# Patient Record
Sex: Male | Born: 1975 | Race: Black or African American | Hispanic: No | Marital: Single | Smoking: Current every day smoker
Health system: Southern US, Community
[De-identification: ages and names within clinical notes are randomized; demographics above are authoritative.]

## PROBLEM LIST (undated history)

## (undated) HISTORY — PX: WRIST SURGERY: SHX841

---

## 2005-12-07 ENCOUNTER — Emergency Department: Payer: Self-pay | Admitting: Emergency Medicine

## 2006-07-10 ENCOUNTER — Emergency Department: Payer: Self-pay | Admitting: Emergency Medicine

## 2008-09-12 ENCOUNTER — Emergency Department: Payer: Self-pay | Admitting: Emergency Medicine

## 2012-05-10 ENCOUNTER — Emergency Department: Payer: Self-pay | Admitting: Emergency Medicine

## 2012-05-20 ENCOUNTER — Emergency Department: Payer: Self-pay | Admitting: Emergency Medicine

## 2019-06-14 ENCOUNTER — Emergency Department
Admission: EM | Admit: 2019-06-14 | Discharge: 2019-06-14 | Disposition: A | Discharge status: Home / Self Care | Payer: Self-pay | Attending: Emergency Medicine | Admitting: Emergency Medicine

## 2019-06-14 ENCOUNTER — Other Ambulatory Visit: Payer: Self-pay

## 2019-06-14 ENCOUNTER — Emergency Department: Payer: Self-pay

## 2019-06-14 ENCOUNTER — Encounter: Payer: Self-pay | Admitting: Emergency Medicine

## 2019-06-14 DIAGNOSIS — R079 Chest pain, unspecified: Secondary | ICD-10-CM

## 2019-06-14 DIAGNOSIS — K2900 Acute gastritis without bleeding: Secondary | ICD-10-CM | POA: Insufficient documentation

## 2019-06-14 DIAGNOSIS — F17210 Nicotine dependence, cigarettes, uncomplicated: Secondary | ICD-10-CM | POA: Insufficient documentation

## 2019-06-14 LAB — CBC WITH DIFFERENTIAL/PLATELET
Abs Immature Granulocytes: 0.02 10*3/uL (ref 0.00–0.07)
Basophils Absolute: 0 10*3/uL (ref 0.0–0.1)
Basophils Relative: 0 %
Eosinophils Absolute: 0 10*3/uL (ref 0.0–0.5)
Eosinophils Relative: 0 %
HCT: 41.5 % (ref 39.0–52.0)
Hemoglobin: 14.5 g/dL (ref 13.0–17.0)
Immature Granulocytes: 0 %
Lymphocytes Relative: 25 %
Lymphs Abs: 1.8 10*3/uL (ref 0.7–4.0)
MCH: 30.7 pg (ref 26.0–34.0)
MCHC: 34.9 g/dL (ref 30.0–36.0)
MCV: 87.7 fL (ref 80.0–100.0)
Monocytes Absolute: 0.4 10*3/uL (ref 0.1–1.0)
Monocytes Relative: 6 %
Neutro Abs: 4.7 10*3/uL (ref 1.7–7.7)
Neutrophils Relative %: 69 %
Platelets: 276 10*3/uL (ref 150–400)
RBC: 4.73 MIL/uL (ref 4.22–5.81)
RDW: 13.1 % (ref 11.5–15.5)
WBC: 7 10*3/uL (ref 4.0–10.5)
nRBC: 0 % (ref 0.0–0.2)

## 2019-06-14 LAB — COMPREHENSIVE METABOLIC PANEL
ALT: 13 U/L (ref 0–44)
AST: 17 U/L (ref 15–41)
Albumin: 4 g/dL (ref 3.5–5.0)
Alkaline Phosphatase: 44 U/L (ref 38–126)
Anion gap: 12 (ref 5–15)
BUN: 10 mg/dL (ref 6–20)
CO2: 21 mmol/L — ABNORMAL LOW (ref 22–32)
Calcium: 8.6 mg/dL — ABNORMAL LOW (ref 8.9–10.3)
Chloride: 104 mmol/L (ref 98–111)
Creatinine, Ser: 0.96 mg/dL (ref 0.61–1.24)
GFR calc Af Amer: >60 mL/min (ref >60)
GFR calc non Af Amer: >60 mL/min (ref >60)
Glucose, Bld: 111 mg/dL — ABNORMAL HIGH (ref 70–99)
Potassium: 3.6 mmol/L (ref 3.5–5.1)
Sodium: 137 mmol/L (ref 135–145)
Total Bilirubin: 0.5 mg/dL (ref 0.3–1.2)
Total Protein: 6.4 g/dL — ABNORMAL LOW (ref 6.5–8.1)

## 2019-06-14 LAB — LIPASE, BLOOD: Lipase: 25 U/L (ref 11–51)

## 2019-06-14 LAB — TROPONIN I (HIGH SENSITIVITY): Troponin I (High Sensitivity): 4 ng/L (ref <18)

## 2019-06-14 MED ORDER — SUCRALFATE 1 GM/10ML PO SUSP: 1.0000 g | Freq: Four times a day (QID) | ORAL | 1 refills | Status: DC

## 2019-06-14 MED ORDER — ALUM & MAG HYDROXIDE-SIMETH 200-200-20 MG/5ML PO SUSP
30.0000 mL | Freq: Once | ORAL | Status: AC
  Administered 2019-06-14: 03:00:00 30 mL via ORAL
  Filled 2019-06-14: qty 30

## 2019-06-14 MED ORDER — SUCRALFATE 1 GM/10ML PO SUSP: 1.0000 g | Freq: Four times a day (QID) | ORAL | 0 refills | Status: AC

## 2019-06-14 MED ORDER — FAMOTIDINE 20 MG PO TABS: 20.0000 mg | ORAL_TABLET | Freq: Two times a day (BID) | ORAL | 0 refills | Status: AC

## 2019-06-14 MED ORDER — LIDOCAINE VISCOUS HCL 2 % MT SOLN
15.0000 mL | Freq: Once | OROMUCOSAL | Status: AC
  Administered 2019-06-14: 03:00:00 15 mL via ORAL
  Filled 2019-06-14: qty 15

## 2019-06-14 MED ORDER — FAMOTIDINE 20 MG PO TABS: 20.0000 mg | ORAL_TABLET | Freq: Two times a day (BID) | ORAL | 0 refills | Status: DC

## 2019-06-14 MED ORDER — FAMOTIDINE 20 MG PO TABS
20.0000 mg | ORAL_TABLET | Freq: Once | ORAL | Status: AC
  Administered 2019-06-14: 20 mg via ORAL
  Filled 2019-06-14: qty 1

## 2019-06-14 NOTE — ED Triage Notes (Signed)
Patient ambulatory to triage with steady gait, without difficulty or distress noted, mask in place; pt reports mid CP radiating into left lateral ribcage accomp by [facility] intermittently for 39mos

## 2019-06-14 NOTE — Discharge Instructions (Signed)
1.  Start Pepcid 20 mg twice daily. 2.  Start Carafate 4 times daily. 3.  Avoid heavy and spicy foods. 4.  Return to the ER for worsening symptoms, persistent vomiting, difficulty breathing or other concerns.

## 2019-06-14 NOTE — ED Provider Notes (Signed)
[facility] Emergency Department Provider Note   ____________________________________________   First MD Initiated Contact with Patient 06/14/19 [phone]     (approximate)  I have reviewed the triage vital signs and the nursing notes.   HISTORY  Chief Complaint Chest Pain    HPI Corey Cooke is a 44 y.o. male who presents to the ED from home with a chief complaint of chest pain.  Patient reports symptoms since 2017.  Was evaluated once which was a normal evaluation.  Reports occasional chest tightness/burning sometimes associated with shortness of breath.  Reports symptoms more often occurs before 2 PM.  Some mornings he wakes up vomiting.  Reports daily drinking (beer after work with a shot of liquor).  No history of DTs.  Takes Tylenol but no NSAIDs such as aspirin, ibuprofen or Goody powder.  Denies fever, cough, abdominal pain, diarrhea.  Denies recent travel, trauma or hormone use.       Past medical history None  There are no problems to display for this patient.   Past Surgical History:  Procedure Laterality Date  . WRIST SURGERY Left     Prior to Admission medications   Medication Sig Start Date End Date Taking? Authorizing Provider  famotidine (PEPCID) 20 MG tablet Take 1 tablet (20 mg total) by mouth 2 (two) times daily. 06/14/19   Paulette Blanch, MD  sucralfate (CARAFATE) 1 GM/10ML suspension Take 10 mLs (1 g total) by mouth 4 (four) times daily. 06/14/19   Paulette Blanch, MD    Allergies Patient has no known allergies.  Family history None for CAD  Social History Social History   Tobacco Use  . Smoking status: Current Every Day Smoker  . Smokeless tobacco: Never Used  Substance Use Topics  . Alcohol use: Not on file  . Drug use: Not on file  Daily alcohol use  Review of Systems  Constitutional: No fever/chills Eyes: No visual changes. ENT: No sore throat. Cardiovascular: Positive for chest pain. Respiratory: Denies  shortness of breath. Gastrointestinal: No abdominal pain.  No nausea, no vomiting.  No diarrhea.  No constipation. Genitourinary: Negative for dysuria. Musculoskeletal: Negative for back pain. Skin: Negative for rash. Neurological: Negative for headaches, focal weakness or numbness.   ____________________________________________   PHYSICAL EXAM:  VITAL SIGNS: ED Triage Vitals  Enc Vitals Group     BP 06/14/19 0054 (!) 132/91     Pulse Rate 06/14/19 0054 99     Resp 06/14/19 0054 19     Temp 06/14/19 0054 98.6 F (37 C)     Temp Source 06/14/19 0054 Oral     SpO2 06/14/19 0054 96 %     Weight 06/14/19 0038 170 lb (77.1 kg)     Height 06/14/19 0038 6\' 1"  (1.854 m)     Head Circumference --      Peak Flow --      Pain Score 06/14/19 0038 2     Pain Loc --      Pain Edu? --      Excl. in [place]? --     Constitutional: Alert and oriented. Well appearing and in no acute distress. Eyes: Conjunctivae are normal. PERRL. EOMI. Head: Atraumatic. Nose: No congestion/rhinnorhea. Mouth/Throat: Mucous membranes are moist.  Oropharynx non-erythematous. Neck: No stridor.   Cardiovascular: Normal rate, regular rhythm. Grossly normal heart sounds.  Good peripheral circulation. Respiratory: Normal respiratory effort.  No retractions. Lungs CTAB. Gastrointestinal: Soft and nontender to light or deep palpation. No  distention. No abdominal bruits. No CVA tenderness. Musculoskeletal: No lower extremity tenderness nor edema.  No joint effusions. Neurologic:  Normal speech and language. No gross focal neurologic deficits are appreciated. No gait instability. Skin:  Skin is warm, dry and intact. No rash noted. Psychiatric: Mood and affect are normal. Speech and behavior are normal.  ____________________________________________   LABS (all labs ordered are listed, but only abnormal results are displayed)  Labs Reviewed  COMPREHENSIVE METABOLIC PANEL - Abnormal; Notable for the following  components:      Result Value   CO2 21 (*)    Glucose, Bld 111 (*)    Calcium 8.6 (*)    Total Protein 6.4 (*)    All other components within normal limits  CBC WITH DIFFERENTIAL/PLATELET  LIPASE, BLOOD  TROPONIN I (HIGH SENSITIVITY)   ____________________________________________  EKG  ED ECG REPORT I, Mang Hazelrigg J, the attending physician, personally viewed and interpreted this ECG.   Date: 06/14/2019  EKG Time: 0058  Rate: 82  Rhythm: normal EKG, normal sinus rhythm  Axis: Normal  Intervals:none  ST&T Change: Nonspecific  ____________________________________________  RADIOLOGY  ED MD interpretation: No acute cardiopulmonary process  Official radiology report(s): DG Chest 2 View  Result Date: 06/14/2019 CLINICAL DATA:  Mid chest pain radiating to left lateral thoracic cage, intermittent shortness of breath EXAM: CHEST - 2 VIEW COMPARISON:  None. FINDINGS: Frontal and lateral views of the chest demonstrate an unremarkable cardiac silhouette. No airspace disease, effusion, or pneumothorax. No acute displaced fractures. IMPRESSION: 1. No acute intrathoracic process. Electronically Signed   By: Sharlet Salina M.D.   On: 06/14/2019 01:05    ____________________________________________   PROCEDURES  Procedure(s) performed (including Critical Care):  Procedures   ____________________________________________   INITIAL IMPRESSION / ASSESSMENT AND PLAN / ED COURSE  As part of my medical decision making, I reviewed the following data within the electronic MEDICAL RECORD NUMBER Nursing notes reviewed and incorporated, Labs reviewed, EKG interpreted, Old chart reviewed, Radiograph reviewed and Notes from prior ED visits     Corey Cooke was evaluated in Emergency Department on 06/14/2019 for the symptoms described in the history of present illness. He was evaluated in the context of the global COVID-19 pandemic, which necessitated consideration that the patient might be at  risk for infection with the SARS-CoV-2 virus that causes COVID-19. Institutional protocols and algorithms that pertain to the evaluation of patients at risk for COVID-19 are in a state of rapid change based on information released by regulatory bodies including the CDC and federal and state organizations. These policies and algorithms were followed during the patient's care in the ED.    44 year old male who presents with chest pain since 2017. Differential diagnosis includes, but is not limited to, ACS, aortic dissection, pulmonary embolism, cardiac tamponade, pneumothorax, pneumonia, pericarditis, myocarditis, GI-related causes including esophagitis/gastritis, and musculoskeletal chest wall pain.    Troponin and EKG unremarkable.  Very low suspicion for cardiac etiology as patient has had symptoms for several years with negative troponin.  Given his drinking habits, more likely gastritis.  Will check lipase.  Administer GI cocktail, Pepcid.  Will start patient on Pepcid, Carafate and refer to GI for follow-up.   Clinical Course as of Jun 14 431  Tue Jun 14, 2019  2202 Patient feeling better after GI cocktail.  Lipase is unremarkable.  Will discharge home on Pepcid, Carafate and GI follow-up.  Strict return precautions given.  Verbalizes understanding agrees with plan of care.   [  JS]    Clinical Course User Index [JS] Irean Hong, MD     ____________________________________________   FINAL CLINICAL IMPRESSION(S) / ED DIAGNOSES  Final diagnoses:  Acute gastritis without hemorrhage, unspecified gastritis type  Nonspecific chest pain     ED Discharge Orders         Ordered    famotidine (PEPCID) 20 MG tablet  2 times daily,   Status:  Discontinued     06/14/19 0319    sucralfate (CARAFATE) 1 GM/10ML suspension  4 times daily,   Status:  Discontinued     06/14/19 0319    famotidine (PEPCID) 20 MG tablet  2 times daily     06/14/19 0321    sucralfate (CARAFATE) 1 GM/10ML suspension   4 times daily     06/14/19 0321           Note:  This document was prepared using Dragon voice recognition software and may include unintentional dictation errors.   Irean Hong, MD 06/14/19 [phone]

## 2020-03-13 ENCOUNTER — Emergency Department: Payer: Medicaid - Out of State

## 2020-03-13 ENCOUNTER — Emergency Department
Admission: EM | Admit: 2020-03-13 | Discharge: 2020-03-13 | Disposition: A | Discharge status: Home / Self Care | Payer: Medicaid - Out of State | Attending: Emergency Medicine | Admitting: Emergency Medicine

## 2020-03-13 ENCOUNTER — Encounter: Payer: Self-pay | Admitting: *Deleted

## 2020-03-13 ENCOUNTER — Other Ambulatory Visit: Payer: Self-pay

## 2020-03-13 DIAGNOSIS — M791 Myalgia, unspecified site: Secondary | ICD-10-CM | POA: Diagnosis not present

## 2020-03-13 DIAGNOSIS — R509 Fever, unspecified: Secondary | ICD-10-CM | POA: Diagnosis present

## 2020-03-13 DIAGNOSIS — Z5321 Procedure and treatment not carried out due to patient leaving prior to being seen by health care provider: Secondary | ICD-10-CM | POA: Insufficient documentation

## 2020-03-13 DIAGNOSIS — Z20822 Contact with and (suspected) exposure to covid-19: Secondary | ICD-10-CM | POA: Diagnosis not present

## 2020-03-13 DIAGNOSIS — R059 Cough, unspecified: Secondary | ICD-10-CM | POA: Diagnosis not present

## 2020-03-13 LAB — COMPREHENSIVE METABOLIC PANEL
ALT: 13 U/L (ref 0–44)
AST: 15 U/L (ref 15–41)
Albumin: 2.9 g/dL — ABNORMAL LOW (ref 3.5–5.0)
Alkaline Phosphatase: 48 U/L (ref 38–126)
Anion gap: 10 (ref 5–15)
BUN: 12 mg/dL (ref 6–20)
CO2: 24 mmol/L (ref 22–32)
Calcium: 8.5 mg/dL — ABNORMAL LOW (ref 8.9–10.3)
Chloride: 101 mmol/L (ref 98–111)
Creatinine, Ser: 1.04 mg/dL (ref 0.61–1.24)
GFR, Estimated: >60 mL/min (ref >60)
Glucose, Bld: 124 mg/dL — ABNORMAL HIGH (ref 70–99)
Potassium: 3.7 mmol/L (ref 3.5–5.1)
Sodium: 135 mmol/L (ref 135–145)
Total Bilirubin: 0.4 mg/dL (ref 0.3–1.2)
Total Protein: 6.6 g/dL (ref 6.5–8.1)

## 2020-03-13 LAB — CBC
HCT: 36.9 % — ABNORMAL LOW (ref 39.0–52.0)
Hemoglobin: 12.5 g/dL — ABNORMAL LOW (ref 13.0–17.0)
MCH: 30.9 pg (ref 26.0–34.0)
MCHC: 33.9 g/dL (ref 30.0–36.0)
MCV: 91.3 fL (ref 80.0–100.0)
Platelets: 355 10*3/uL (ref 150–400)
RBC: 4.04 MIL/uL — ABNORMAL LOW (ref 4.22–5.81)
RDW: 12.9 % (ref 11.5–15.5)
WBC: 16.4 10*3/uL — ABNORMAL HIGH (ref 4.0–10.5)
nRBC: 0 % (ref 0.0–0.2)

## 2020-03-13 LAB — RESP PANEL BY RT-PCR (FLU A&B, COVID) ARPGX2
Influenza A by PCR: NEGATIVE
Influenza B by PCR: NEGATIVE
SARS Coronavirus 2 by RT PCR: NEGATIVE

## 2020-03-13 MED ORDER — ACETAMINOPHEN 325 MG PO TABS
650.0000 mg | ORAL_TABLET | Freq: Once | ORAL | Status: AC | PRN
  Administered 2020-03-13: 650 mg via ORAL
  Filled 2020-03-13: qty 2

## 2020-03-13 NOTE — ED Triage Notes (Cosign Needed)
Pt ambulatory to triage.  Pt has fever, chills, cough, body aches,   Sx for 4-5 days.  Pt unsure of covid exposure.  Pt alert  Speech clear.

## 2020-03-14 ENCOUNTER — Encounter: Payer: Self-pay | Admitting: Emergency Medicine

## 2020-03-14 ENCOUNTER — Other Ambulatory Visit: Payer: Self-pay

## 2020-03-14 ENCOUNTER — Emergency Department
Admission: EM | Admit: 2020-03-14 | Discharge: 2020-03-14 | Disposition: A | Discharge status: Home / Self Care | Payer: Medicaid - Out of State | Attending: Emergency Medicine | Admitting: Emergency Medicine

## 2020-03-14 DIAGNOSIS — M791 Myalgia, unspecified site: Secondary | ICD-10-CM | POA: Diagnosis not present

## 2020-03-14 DIAGNOSIS — J188 Other pneumonia, unspecified organism: Secondary | ICD-10-CM | POA: Insufficient documentation

## 2020-03-14 DIAGNOSIS — R911 Solitary pulmonary nodule: Secondary | ICD-10-CM | POA: Insufficient documentation

## 2020-03-14 DIAGNOSIS — F17200 Nicotine dependence, unspecified, uncomplicated: Secondary | ICD-10-CM | POA: Insufficient documentation

## 2020-03-14 DIAGNOSIS — R059 Cough, unspecified: Secondary | ICD-10-CM

## 2020-03-14 DIAGNOSIS — J984 Other disorders of lung: Secondary | ICD-10-CM

## 2020-03-14 DIAGNOSIS — J189 Pneumonia, unspecified organism: Secondary | ICD-10-CM

## 2020-03-14 MED ORDER — ETHAMBUTOL HCL 400 MG PO TABS
800.0000 mg | ORAL_TABLET | Freq: Once | ORAL | Status: AC
  Administered 2020-03-14: 800 mg via ORAL
  Filled 2020-03-14: qty 2

## 2020-03-14 MED ORDER — RIFAMPIN 300 MG PO CAPS
600.0000 mg | ORAL_CAPSULE | Freq: Once | ORAL | Status: AC
  Administered 2020-03-14: 600 mg via ORAL
  Filled 2020-03-14: qty 2

## 2020-03-14 MED ORDER — ISONIAZID 300 MG PO TABS
300.0000 mg | ORAL_TABLET | Freq: Once | ORAL | Status: AC
  Administered 2020-03-14: 300 mg via ORAL
  Filled 2020-03-14: qty 1

## 2020-03-14 MED ORDER — DOXYCYCLINE HYCLATE 50 MG PO CAPS: 100.0000 mg | ORAL_CAPSULE | Freq: Two times a day (BID) | ORAL | 0 refills | Status: AC

## 2020-03-14 MED ORDER — PYRAZINAMIDE 500 MG PO TABS
1000.0000 mg | ORAL_TABLET | Freq: Once | ORAL | Status: AC
  Administered 2020-03-14: 1000 mg via ORAL
  Filled 2020-03-14: qty 2

## 2020-03-14 NOTE — ED Triage Notes (Signed)
Patient ambulatory to triage with steady gait, without difficulty or distress noted; pt here earlier tonight but LWBS; pt reports that he needed to take his father home and now back to be evaluated by the provider

## 2020-03-14 NOTE — ED Provider Notes (Signed)
[facility] Emergency Department Provider Note   ____________________________________________   First MD Initiated Contact with Patient 03/14/20 [phone]     (approximate)  I have reviewed the triage vital signs and the nursing notes.   HISTORY  Chief Complaint Cough    HPI Corey Cooke is a 44 y.o. male with a stated past medical history of tuberculosis who presents for fevers, chills, cough, body aches, night sweats that have been worsening over the last 5 days.  Patient endorses mild associated shortness of breath.  Patient denies any exacerbating or relieving factors for the symptoms.  Patient has not tried any medications to try to alleviate the symptoms.  Patient denies any recent travel or incarceration.  Patient denies any immunocompromising diagnoses or medications         History reviewed. No pertinent past medical history.  There are no problems to display for this patient.   Past Surgical History:  Procedure Laterality Date  . WRIST SURGERY Left     Prior to Admission medications   Medication Sig Start Date End Date Taking? Authorizing Provider  famotidine (PEPCID) 20 MG tablet Take 1 tablet (20 mg total) by mouth 2 (two) times daily. 06/14/19   Irean Hong, MD  sucralfate (CARAFATE) 1 GM/10ML suspension Take 10 mLs (1 g total) by mouth 4 (four) times daily. 06/14/19   Irean Hong, MD    Allergies Patient has no known allergies.  No family history on file.  Social History Social History   Tobacco Use  . Smoking status: Current Every Day Smoker  . Smokeless tobacco: Never Used  Vaping Use  . Vaping Use: Never used  Substance Use Topics  . Alcohol use: Yes  . Drug use: Not Currently    Review of Systems Constitutional: Endorses fever/chills Eyes: No visual changes. ENT: No sore throat. Cardiovascular: Denies chest pain. Respiratory: Endorses shortness of breath and cough Gastrointestinal: No abdominal pain.  No  nausea, no vomiting.  No diarrhea. Genitourinary: Negative for dysuria. Musculoskeletal: Negative for acute arthralgias Skin: Negative for rash. Neurological: Negative for headaches, weakness/numbness/paresthesias in any extremity Psychiatric: Negative for suicidal ideation/homicidal ideation   ____________________________________________   PHYSICAL EXAM:  VITAL SIGNS: ED Triage Vitals  Enc Vitals Group     BP 03/14/20 0030 122/83     Pulse Rate 03/14/20 0030 87     Resp 03/14/20 0030 20     Temp 03/14/20 0030 99.3 F (37.4 C)     Temp Source 03/14/20 0030 Oral     SpO2 03/14/20 0030 100 %     Weight 03/14/20 0024 164 lb 14.5 oz (74.8 kg)     Height 03/14/20 0024 6\' 2"  (1.88 m)     Head Circumference --      Peak Flow --      Pain Score 03/14/20 0024 0     Pain Loc --      Pain Edu? --      Excl. in [place]? --    Constitutional: Alert and oriented. Well appearing and in no acute distress. Eyes: Conjunctivae are normal. PERRL. Head: Atraumatic. Nose: No congestion/rhinnorhea. Mouth/Throat: Mucous membranes are moist. Neck: No stridor Cardiovascular: Grossly normal heart sounds.  Good peripheral circulation. Respiratory: Normal respiratory effort.  No retractions. Gastrointestinal: Soft and nontender. No distention. Musculoskeletal: No obvious deformities Neurologic:  Normal speech and language. No gross focal neurologic deficits are appreciated. Skin:  Skin is warm and dry. No rash noted. Psychiatric: Mood and affect  are normal. Speech and behavior are normal.  ____________________________________________   LABS (all labs ordered are listed, but only abnormal results are displayed)  Labs Reviewed  ACID FAST SMEAR (AFB, MYCOBACTERIA)  ACID FAST CULTURE WITH REFLEXED SENSITIVITIES (MYCOBACTERIA)   ____________________________________________  EKG  RADIOLOGY  ED MD interpretation: 2 view x-ray of the chest shows multifocal left greater than right airspace  opacities consistent with possible multifocal pneumonia with probable cavitary lesion in the left lower lobe  Official radiology report(s): DG Chest 2 View  Result Date: 03/13/2020 CLINICAL DATA:  Cough EXAM: CHEST - 2 VIEW COMPARISON:  06/14/2019 FINDINGS: Multifocal left greater than right airspace opacities. 5.6 cm probable cavitary lesion in the left lower lobe. No pleural effusion. Normal heart size. No pneumothorax. IMPRESSION: Multifocal left greater than right airspace opacities consistent with multifocal pneumonia with probable cavitary lesion in the left lower lobe. Consider chest CT for further evaluation. Electronically Signed   By: Jasmine Pang M.D.   On: 03/13/2020 19:59    ____________________________________________   PROCEDURES  Procedure(s) performed (including Critical Care):  Procedures   ____________________________________________   INITIAL IMPRESSION / ASSESSMENT AND PLAN / ED COURSE  As part of my medical decision making, I reviewed the following data within the electronic MEDICAL RECORD NUMBER Nursing notes reviewed and incorporated, Labs reviewed, Old chart reviewed, Radiograph reviewed and Notes from prior ED visits reviewed and incorporated        Patient is a 44 year old male with above-stated past medical history the presents for 5 days of worsening cough, chills, shortness of breath, and body aches.   Differential diagnosis includes pneumonia, reactivation tuberculosis, URI, Covid/flu Laboratory evaluation significant for leukocytosis to 16 2 view x-ray of the chest concerning for possible left lower lobe cavitary lesion  Given these radiologic findings, will treat patient empirically for reactivation TB.  Patient states that he has appropriate means to quarantine.  Patient was given patient instructions as well as resources in the community to continue his therapy.  Will obtain a sputum for AFB culture and smear.  We will treat empirically with [facility]  therapy  Patient is stable, not hypoxic, not septic, and ambulatory without difficulty.  Therefore patient is safe for discharge home at this time with strict return precautions as well as strict quarantine precautions and follow-up for continued medication management      ____________________________________________   FINAL CLINICAL IMPRESSION(S) / ED DIAGNOSES  Final diagnoses:  Cough  Multifocal pneumonia  Cavitary lesion of lung     ED Discharge Orders    None       Note:  This document was prepared using Dragon voice recognition software and may include unintentional dictation errors.   Merwyn Katos, MD 03/14/20 [phone]

## 2020-03-15 LAB — ACID FAST SMEAR (AFB, MYCOBACTERIA): Acid Fast Smear: NEGATIVE

## 2020-05-11 LAB — ACID FAST CULTURE WITH REFLEXED SENSITIVITIES (MYCOBACTERIA): Acid Fast Culture: NEGATIVE

## 2020-12-07 ENCOUNTER — Other Ambulatory Visit: Payer: Self-pay

## 2020-12-07 ENCOUNTER — Emergency Department: Payer: Self-pay

## 2020-12-07 ENCOUNTER — Emergency Department
Admission: EM | Admit: 2020-12-07 | Discharge: 2020-12-07 | Disposition: A | Discharge status: Home / Self Care | Payer: Self-pay | Attending: Emergency Medicine | Admitting: Emergency Medicine

## 2020-12-07 DIAGNOSIS — Y92410 Unspecified street and highway as the place of occurrence of the external cause: Secondary | ICD-10-CM | POA: Insufficient documentation

## 2020-12-07 DIAGNOSIS — M791 Myalgia, unspecified site: Secondary | ICD-10-CM | POA: Insufficient documentation

## 2020-12-07 DIAGNOSIS — R0789 Other chest pain: Secondary | ICD-10-CM | POA: Insufficient documentation

## 2020-12-07 DIAGNOSIS — M7918 Myalgia, other site: Secondary | ICD-10-CM

## 2020-12-07 DIAGNOSIS — F17200 Nicotine dependence, unspecified, uncomplicated: Secondary | ICD-10-CM | POA: Insufficient documentation

## 2020-12-07 MED ORDER — NAPROXEN 500 MG PO TABS: 500.0000 mg | ORAL_TABLET | Freq: Two times a day (BID) | ORAL | Status: DC

## 2020-12-07 MED ORDER — ORPHENADRINE CITRATE ER 100 MG PO TB12: 100.0000 mg | ORAL_TABLET | Freq: Two times a day (BID) | ORAL | 0 refills | Status: DC
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
## 2020-12-07 NOTE — Discharge Instructions (Addendum)
No acute findings on x-ray of the chest.  Read and follow discharge care instruction.  Take medication as directed.  6 tabs [facility]

## 2020-12-17 ENCOUNTER — Other Ambulatory Visit: Payer: Self-pay

## 2020-12-17 ENCOUNTER — Emergency Department: Payer: Self-pay

## 2020-12-17 ENCOUNTER — Emergency Department
Admission: EM | Admit: 2020-12-17 | Discharge: 2020-12-17 | Disposition: A | Discharge status: Home / Self Care | Payer: Self-pay | Attending: Emergency Medicine | Admitting: Emergency Medicine

## 2020-12-17 DIAGNOSIS — F17200 Nicotine dependence, unspecified, uncomplicated: Secondary | ICD-10-CM | POA: Insufficient documentation

## 2020-12-17 DIAGNOSIS — M545 Low back pain, unspecified: Secondary | ICD-10-CM | POA: Insufficient documentation

## 2020-12-17 DIAGNOSIS — R0789 Other chest pain: Secondary | ICD-10-CM | POA: Insufficient documentation

## 2020-12-17 LAB — BASIC METABOLIC PANEL
Anion gap: 7 (ref 5–15)
BUN: 17 mg/dL (ref 6–20)
CO2: 26 mmol/L (ref 22–32)
Calcium: 8.9 mg/dL (ref 8.9–10.3)
Chloride: 104 mmol/L (ref 98–111)
Creatinine, Ser: 1.03 mg/dL (ref 0.61–1.24)
GFR, Estimated: >60 mL/min (ref >60)
Glucose, Bld: 118 mg/dL — ABNORMAL HIGH (ref 70–99)
Potassium: 3.5 mmol/L (ref 3.5–5.1)
Sodium: 137 mmol/L (ref 135–145)

## 2020-12-17 LAB — TROPONIN I (HIGH SENSITIVITY)
Troponin I (High Sensitivity): 3 ng/L (ref <18)
Troponin I (High Sensitivity): 3 ng/L (ref <18)

## 2020-12-17 LAB — CBC
HCT: 37.4 % — ABNORMAL LOW (ref 39.0–52.0)
Hemoglobin: 12.8 g/dL — ABNORMAL LOW (ref 13.0–17.0)
MCH: 30.1 pg (ref 26.0–34.0)
MCHC: 34.2 g/dL (ref 30.0–36.0)
MCV: 88 fL (ref 80.0–100.0)
Platelets: 248 10*3/uL (ref 150–400)
RBC: 4.25 MIL/uL (ref 4.22–5.81)
RDW: 13.2 % (ref 11.5–15.5)
WBC: 5.2 10*3/uL (ref 4.0–10.5)
nRBC: 0 % (ref 0.0–0.2)

## 2020-12-17 MED ORDER — LIDOCAINE 5 % EX PTCH: 1.0000 | MEDICATED_PATCH | Freq: Two times a day (BID) | CUTANEOUS | 0 refills | Status: AC

## 2020-12-17 NOTE — ED Notes (Signed)
MD at the bedside  
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
## 2020-12-17 NOTE — ED Notes (Signed)
Patient return from radiology.

## 2020-12-17 NOTE — Discharge Instructions (Addendum)
Please seek medical attention for any high fevers, chest pain, shortness of breath, change in behavior, persistent vomiting, bloody stool or any other new or concerning symptoms.  
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
## 2020-12-17 NOTE — ED Notes (Signed)
MD back at the bedside 

## 2020-12-17 NOTE — ED Notes (Signed)
Patient to radiology via wheelchair.
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
## 2022-06-02 IMAGING — CR DG CHEST 2V
1 series · 2 of 2 positions shown · non-contrast
Comparison: 12/07/2020

CLINICAL DATA: Chest pain

EXAM:
CHEST - 2 VIEW

[Series 1: dg chest 2 view · 0.14mm/px · 2 of 2 slices shown]
[im 1/2]
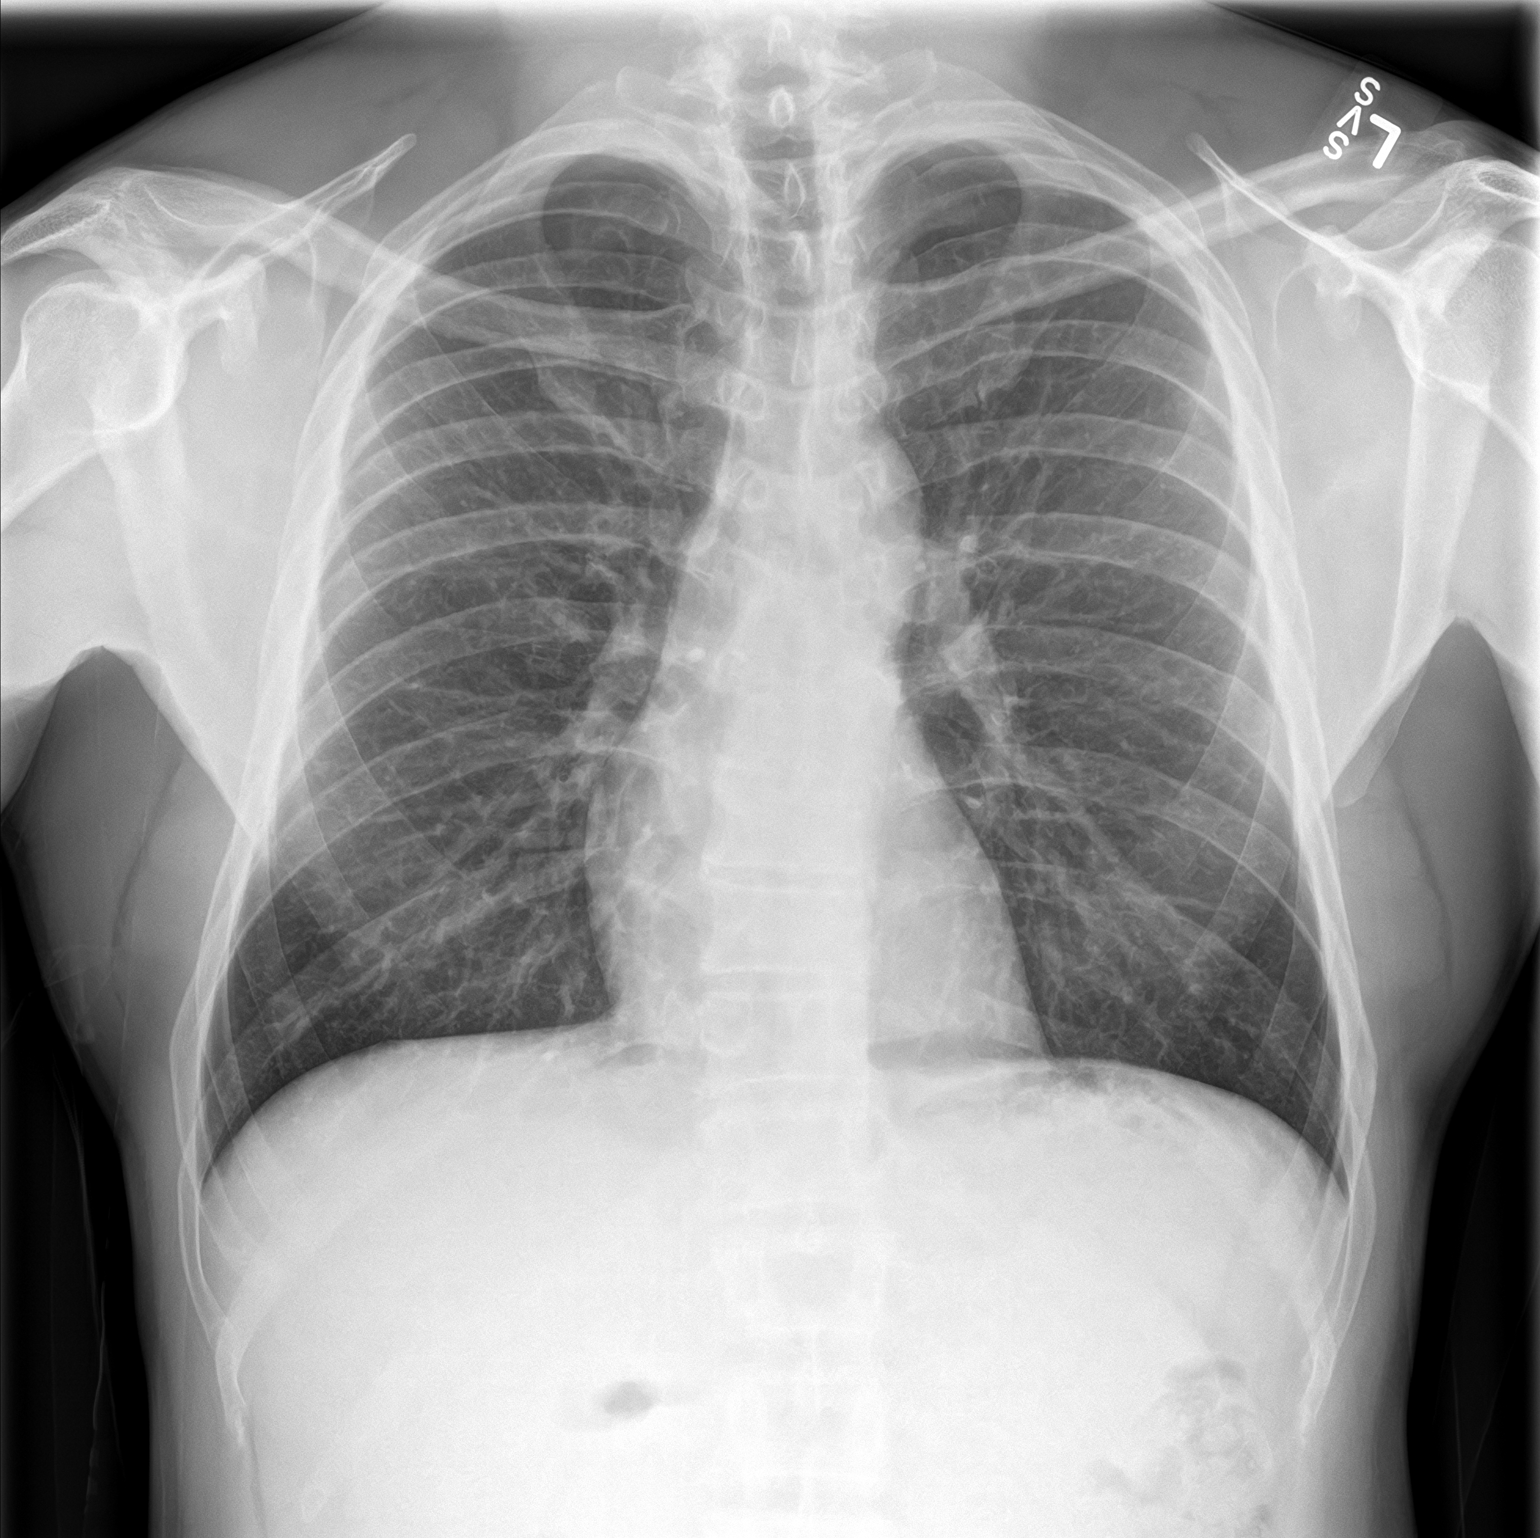
[im 2/2]
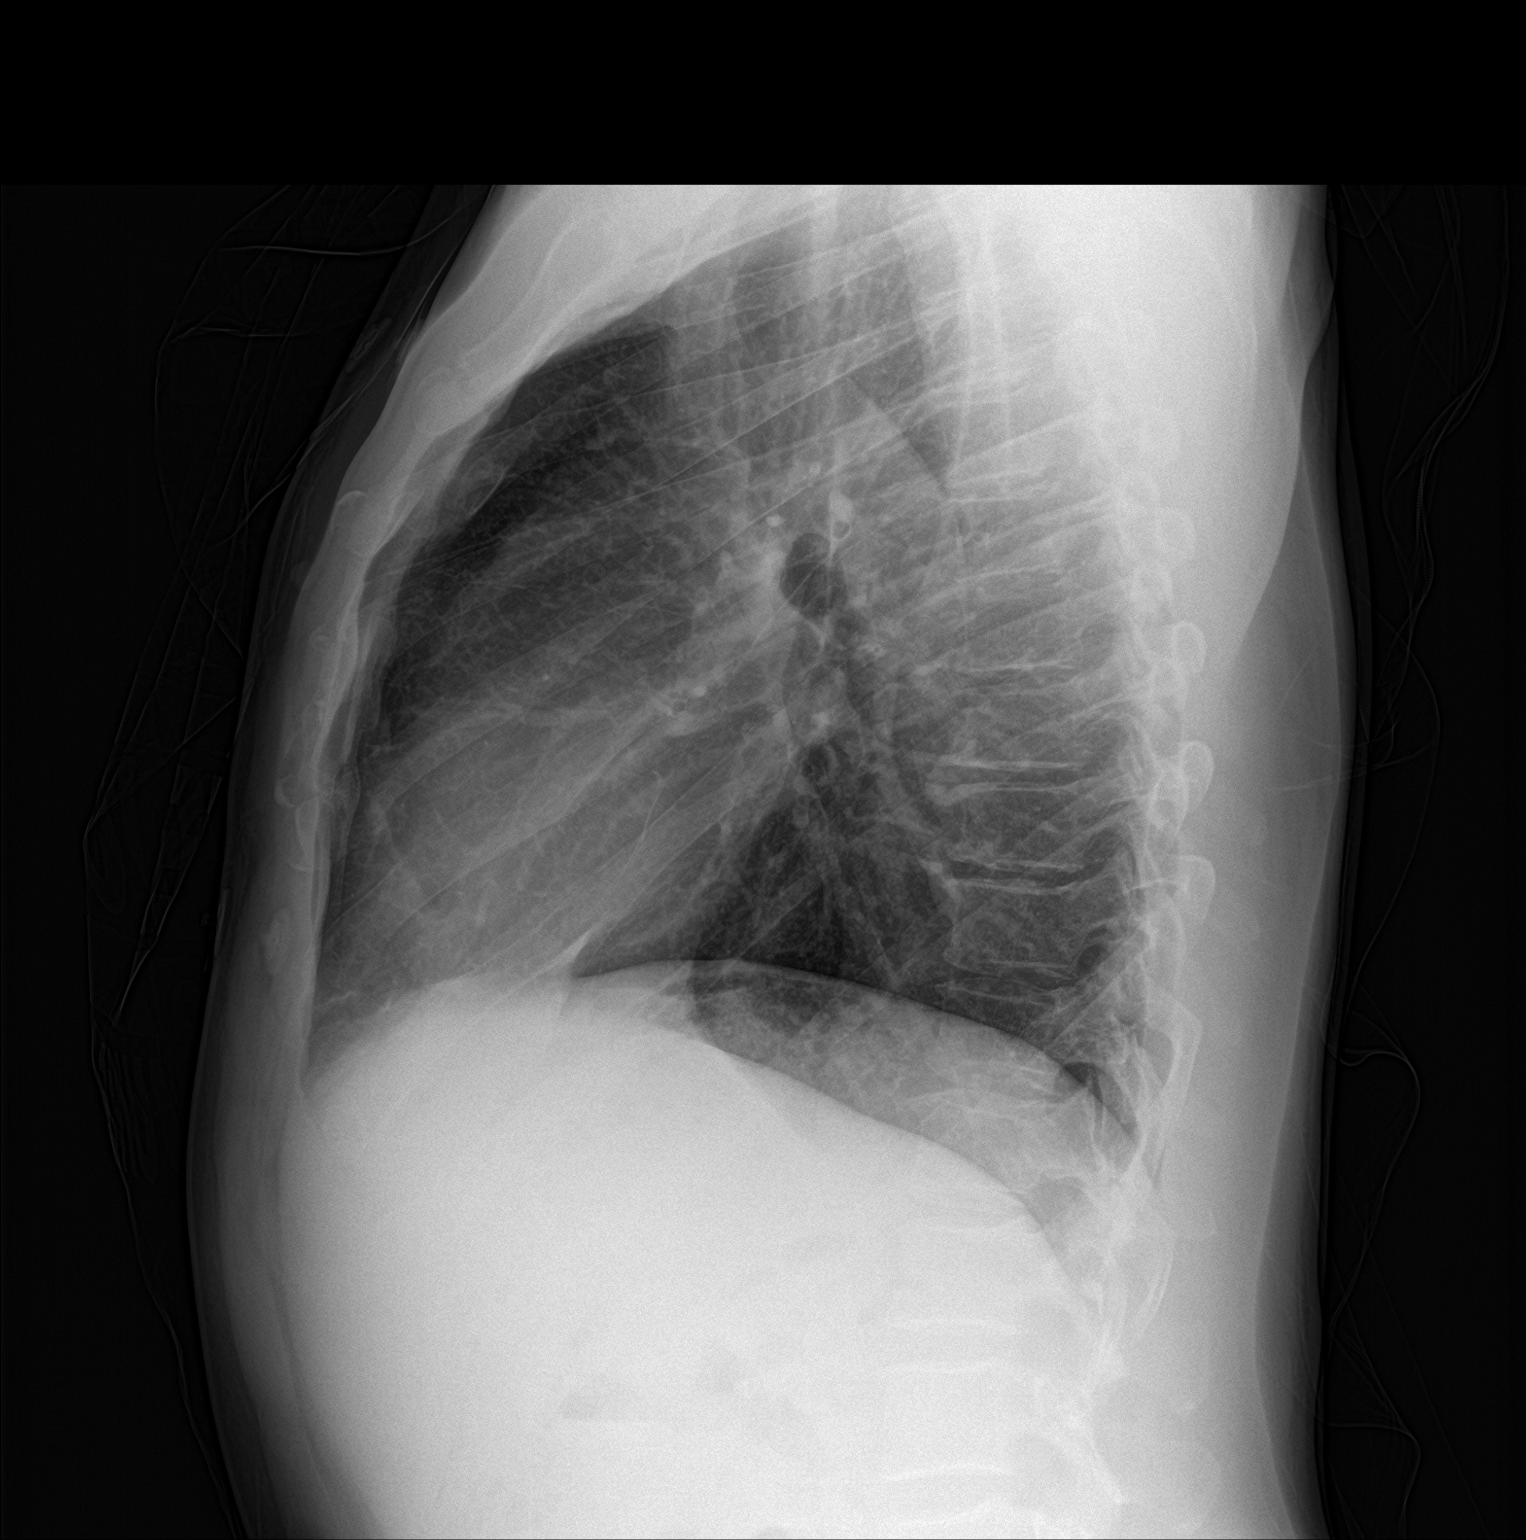

[2 of 2 positions shown; findings below may reference images not displayed]

FINDINGS: The heart size and mediastinal contours are within normal limits.
Both lungs are clear. The visualized skeletal structures are
unremarkable.
IMPRESSION: No active cardiopulmonary disease.

## 2022-09-08 ENCOUNTER — Emergency Department
Admission: EM | Admit: 2022-09-08 | Discharge: 2022-09-08 | Disposition: A | Discharge status: Home / Self Care | Payer: 59 | Attending: Emergency Medicine | Admitting: Emergency Medicine

## 2022-09-08 ENCOUNTER — Other Ambulatory Visit: Payer: Self-pay

## 2022-09-08 DIAGNOSIS — W57XXXA Bitten or stung by nonvenomous insect and other nonvenomous arthropods, initial encounter: Secondary | ICD-10-CM | POA: Insufficient documentation

## 2022-09-08 DIAGNOSIS — S30862A Insect bite (nonvenomous) of penis, initial encounter: Secondary | ICD-10-CM | POA: Diagnosis present

## 2022-09-08 MED ORDER — DOXYCYCLINE MONOHYDRATE 100 MG PO TABS: 100.0000 mg | ORAL_TABLET | Freq: Two times a day (BID) | ORAL | 0 refills | Status: AC

## 2022-09-08 NOTE — ED Provider Triage Note (Signed)
Emergency Medicine Provider Triage Evaluation Note  Corey Cooke, a 47 y.o. male  was evaluated in triage.  Pt complains of tick bites.  Patient reports pulling a tick off of his left hip as well as this penile shaft few days ago.  He irritation at this time.  Patient denies any fevers, chills, sweats, generalized myalgias, or arthralgias.  Review of Systems  Positive: Tick bites Negative: FCS  Physical Exam  BP 122/81 (BP Location: Left Arm)   Pulse 75   Temp 98.7 F (37.1 C) (Oral)   Resp 18   Ht 6\' 2"  (1.88 m)   Wt 81.6 kg   SpO2 96%   BMI 23.11 kg/m  Gen:   Awake, no distress  NAD Resp:  Normal effort CTA MSK:   Moves extremities without difficulty  Other:    Medical Decision Making  Medically screening exam initiated at 4:33 PM.  Appropriate orders placed.  Corey Cooke was informed that the remainder of the evaluation will be completed by another provider, this initial triage assessment does not replace that evaluation, and the importance of remaining in the ED until their evaluation is complete.  Patient to the ED for evaluation of tick bites.  He has concern for some retained tick parts.   [name], PA-C 09/08/22 1636

## 2022-09-08 NOTE — ED Notes (Signed)
Dc instructions and scripts reviewed with pt no questions or concerns at this time. Will follow up as needed. Ambulated with steady gait out of ED.  ?

## 2022-09-08 NOTE — ED Provider Notes (Signed)
[facility] Provider Note  Patient Contact: 5:45 PM (approximate)   History   Insect Bite   HPI  Corey Cooke is a 47 y.o. male presents to the emergency department after patient was bitten by 2 ticks.  Patient was bitten along the distal aspect of the penis and one along the superior aspect of the left pelvis.  Patient states that he removed ticks without any retained parts.  He is unsure how long they were in place.  No surrounding erythema.  Patient denies headache, fever or other constitutional symptoms.      Physical Exam   Triage Vital Signs: ED Triage Vitals  Enc Vitals Group     BP 09/08/22 1627 122/81     Pulse Rate 09/08/22 1627 75     Resp 09/08/22 1627 18     Temp 09/08/22 1627 98.7 F (37.1 C)     Temp Source 09/08/22 1627 Oral     SpO2 09/08/22 1627 96 %     Weight 09/08/22 1629 180 lb (81.6 kg)     Height 09/08/22 1629 6\' 2"  (1.88 m)     Head Circumference --      Peak Flow --      Pain Score 09/08/22 1631 0     Pain Loc --      Pain Edu? --      Excl. in GC? --     Most recent vital signs: Vitals:   09/08/22 1627  BP: 122/81  Pulse: 75  Resp: 18  Temp: 98.7 F (37.1 C)  SpO2: 96%     General: Alert and in no acute distress. Eyes:  PERRL. EOMI. Head: No acute traumatic findings ENT:      Nose: No congestion/rhinnorhea.      Mouth/Throat: Mucous membranes are moist. Neck: No stridor. No cervical spine tenderness to palpation. Cardiovascular:  Good peripheral perfusion Respiratory: Normal respiratory effort without tachypnea or retractions. Lungs CTAB. Good air entry to the bases with no decreased or absent breath sounds. Gastrointestinal: Bowel sounds 4 quadrants. Soft and nontender to palpation. No guarding or rigidity. No palpable masses. No distention. No CVA tenderness. Musculoskeletal: Full range of motion to all extremities.  Neurologic:  No gross focal neurologic deficits are appreciated.  Skin:  Patient has tick bite wounds along the penis and left pelvis.   ED Results / Procedures / Treatments   Labs (all labs ordered are listed, but only abnormal results are displayed) Labs Reviewed - No data to display     PROCEDURES:  Critical Care performed: No  Procedures   MEDICATIONS ORDERED IN ED: Medications - No data to display   IMPRESSION / MDM / ASSESSMENT AND PLAN / ED COURSE  I reviewed the triage vital signs and the nursing notes.                              Assessment and plan Insect bite of penis 47 year old male presents to the emergency department with several states.  Ticks have been removed without any apparent foreign bodies.  There is no surrounding erythema.  Will treat patient with doxycycline twice daily for the next 7 days.     FINAL CLINICAL IMPRESSION(S) / ED DIAGNOSES   Final diagnoses:  Insect bite of penis, initial encounter     Rx / DC Orders   ED Discharge Orders          Ordered  doxycycline (ADOXA) 100 MG tablet  2 times daily        09/08/22 1744             Note:  This document was prepared using Dragon voice recognition software and may include unintentional dictation errors.   Pia Mau [place], PA-C 09/08/22 1747    Concha Se, MD 09/10/22 [phone]

## 2022-09-08 NOTE — ED Triage Notes (Signed)
Pt presents to ED with c/o of tick bite to penis area and groin area. AND noted.

## 2022-09-08 NOTE — Discharge Instructions (Signed)
Take Doxycycline twice daily for the next seven days.  

## 2023-12-24 ENCOUNTER — Emergency Department: Payer: Self-pay

## 2023-12-24 ENCOUNTER — Emergency Department
Admission: EM | Admit: 2023-12-24 | Discharge: 2023-12-24 | Disposition: A | Discharge status: Home / Self Care | Payer: Self-pay | Attending: Emergency Medicine | Admitting: Emergency Medicine

## 2023-12-24 ENCOUNTER — Other Ambulatory Visit: Payer: Self-pay

## 2023-12-24 DIAGNOSIS — R509 Fever, unspecified: Secondary | ICD-10-CM

## 2023-12-24 DIAGNOSIS — B349 Viral infection, unspecified: Secondary | ICD-10-CM | POA: Insufficient documentation

## 2023-12-24 DIAGNOSIS — R051 Acute cough: Secondary | ICD-10-CM

## 2023-12-24 DIAGNOSIS — R52 Pain, unspecified: Secondary | ICD-10-CM

## 2023-12-24 LAB — RESP PANEL BY RT-PCR (RSV, FLU A&B, COVID)  RVPGX2
Influenza A by PCR: NEGATIVE
Influenza B by PCR: NEGATIVE
Resp Syncytial Virus by PCR: NEGATIVE
SARS Coronavirus 2 by RT PCR: NEGATIVE

## 2023-12-24 NOTE — ED Triage Notes (Signed)
 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 Wants to make sure he doesn't have Covid or Swine Flu.

## 2023-12-24 NOTE — Discharge Instructions (Signed)
 You can take 650 mg of Tylenol  or 400 mg ibuprofen every 6 hours as needed for pain.  I put in a referral for you for primary care please make sure to follow-up.

## 2023-12-24 NOTE — ED Provider Notes (Signed)
 [facility] Provider Note    Event Date/Time   First MD Initiated Contact with Patient 12/24/23 1154     (approximate)   History   Fever   HPI  Corey Cooke is a 48 y.o. male with history of GERD presenting with fever on Tuesday.  States Tmax of 102.  Afebrile today.  Has had some lightheadedness, body aches, headache, coughing.  He denies any chest pain or shortness of breath, no nausea vomiting or diarrhea.  No urinary symptoms.  Does work at a slaughterhouse.  No other sick contacts.  No reports of coming to contact with any infected animals.    Physical Exam   Triage Vital Signs: ED Triage Vitals [12/24/23 1112]  Encounter Vitals Group     BP 128/74     Girls Systolic BP Percentile      Girls Diastolic BP Percentile      Boys Systolic BP Percentile      Boys Diastolic BP Percentile      Pulse Rate 88     Resp 18     Temp 97.9 F (36.6 C)     Temp Source Oral     SpO2 98 %     Weight 175 lb (79.4 kg)     Height 6' 2 (1.88 m)     Head Circumference      Peak Flow      Pain Score 0     Pain Loc      Pain Education      Exclude from Growth Chart     Most recent vital signs: Vitals:   12/24/23 1112  BP: 128/74  Pulse: 88  Resp: 18  Temp: 97.9 F (36.6 C)  SpO2: 98%     General: Awake, no distress.  CV:  Good peripheral perfusion. Resp:  Normal effort.  Clear, no tachypnea respiratory distress Abd:  No distention.  Soft nontender Other:  Nontoxic-appearing, no nuchal rigidity, moving all 4 extremities without focal weakness or numbness, steady gait with ambulation, no lower extremity edema.   ED Results / Procedures / Treatments   Labs (all labs ordered are listed, but only abnormal results are displayed) Labs Reviewed  RESP PANEL BY RT-PCR (RSV, FLU A&B, COVID)  [id number]     RADIOLOGY On my independent interpretation, chest x-ray without obvious consolidation   PROCEDURES:  Critical Care performed:  No  Procedures   MEDICATIONS ORDERED IN ED: Medications - No data to display   IMPRESSION / MDM / ASSESSMENT AND PLAN / ED COURSE  I reviewed the triage vital signs and the nursing notes.                              Differential diagnosis includes, but is not limited to, viral illness, COVID, influenza, RSV, pneumonia.  Will get chest x-ray, respiratory viral panel.  With regards to spine fluid, there is but no reported outbreaks or infections in [place] .  He has not been in contact with any infected animals, no other reports of sick contacts at his workplace.  Patient's presentation is most consistent with acute illness / injury with system symptoms.  Independent interpretation of labs and imaging below.  Patient is well-appearing, we will have him follow-up with primary care for further management and reassessment.  Instructed him to take Tylenol  or ibuprofen as needed for body aches or headaches.  Encouraged hydration.  Considered but not  occasion for inpatient admission at this time, he safe for outpatient management.  Will discharge with strict return precautions.  Show this should be conducted patient he is agreeable with this plan.  Discharge.    Clinical Course as of 12/24/23 1234  Thu Dec 24, 2023  1212 Resp panel by RT-PCR (RSV, Flu A&B, Covid) Anterior Nasal Swab Negative [TT]  1230 DG Chest 2 View 1. No acute process.  [TT]    Clinical Course User Index [TT] Waymond Lorelle Cummins, MD     FINAL CLINICAL IMPRESSION(S) / ED DIAGNOSES   Final diagnoses:  Fever, unspecified fever cause  Body aches  Viral illness  Acute cough     Rx / DC Orders   ED Discharge Orders          Ordered    Ambulatory Referral to Primary Care (Establish Care)        12/24/23 1233             Note:  This document was prepared using Dragon voice recognition software and may include unintentional dictation errors.    Waymond Lorelle Cummins, MD 12/24/23 [phone]
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
